# Patient Record
Sex: Female | Born: 1960 | Hispanic: Yes | Marital: Married | Smoking: Never smoker
Health system: Southern US, Community
[De-identification: ages and names within clinical notes are randomized; demographics above are authoritative.]

## PROBLEM LIST (undated history)

## (undated) DIAGNOSIS — K219 Gastro-esophageal reflux disease without esophagitis: Secondary | ICD-10-CM

## (undated) DIAGNOSIS — F32A Depression, unspecified: Secondary | ICD-10-CM

## (undated) DIAGNOSIS — E119 Type 2 diabetes mellitus without complications: Secondary | ICD-10-CM

## (undated) DIAGNOSIS — I2510 Atherosclerotic heart disease of native coronary artery without angina pectoris: Secondary | ICD-10-CM

## (undated) DIAGNOSIS — E7800 Pure hypercholesterolemia, unspecified: Secondary | ICD-10-CM

## (undated) DIAGNOSIS — I10 Essential (primary) hypertension: Secondary | ICD-10-CM

## (undated) DIAGNOSIS — F329 Major depressive disorder, single episode, unspecified: Secondary | ICD-10-CM

## (undated) DIAGNOSIS — I471 Supraventricular tachycardia: Secondary | ICD-10-CM

## (undated) HISTORY — DX: Pure hypercholesterolemia, unspecified: E78.00

## (undated) HISTORY — PX: CARDIAC SURGERY: SHX584

## (undated) HISTORY — DX: Major depressive disorder, single episode, unspecified: F32.9

## (undated) HISTORY — PX: CATARACT EXTRACTION W/ INTRAOCULAR LENS  IMPLANT, BILATERAL: SHX1307

## (undated) HISTORY — DX: Depression, unspecified: F32.A

---

## 1998-12-17 HISTORY — PX: CORONARY ARTERY BYPASS GRAFT: SHX141

## 2000-06-27 ENCOUNTER — Encounter: Payer: Self-pay | Admitting: Thoracic Surgery (Cardiothoracic Vascular Surgery)

## 2000-06-27 ENCOUNTER — Inpatient Hospital Stay (HOSPITAL_COMMUNITY)
Admission: AD | Admit: 2000-06-27 | Discharge: 2000-07-03 | Payer: Self-pay | Admitting: Thoracic Surgery (Cardiothoracic Vascular Surgery)

## 2000-06-28 ENCOUNTER — Encounter: Payer: Self-pay | Admitting: Thoracic Surgery (Cardiothoracic Vascular Surgery)

## 2000-06-29 ENCOUNTER — Encounter: Payer: Self-pay | Admitting: Thoracic Surgery (Cardiothoracic Vascular Surgery)

## 2000-06-30 ENCOUNTER — Encounter: Payer: Self-pay | Admitting: Thoracic Surgery (Cardiothoracic Vascular Surgery)

## 2005-04-11 ENCOUNTER — Emergency Department: Payer: Self-pay | Admitting: Emergency Medicine

## 2010-07-17 HISTORY — PX: CARDIAC CATHETERIZATION: SHX172

## 2012-03-18 ENCOUNTER — Ambulatory Visit: Payer: Self-pay | Admitting: Ophthalmology

## 2012-03-26 ENCOUNTER — Ambulatory Visit: Payer: Self-pay | Admitting: Ophthalmology

## 2012-04-29 ENCOUNTER — Emergency Department: Payer: Self-pay | Admitting: *Deleted

## 2012-04-29 LAB — CBC
HCT: 41.3 % (ref 35.0–47.0)
HGB: 13.4 g/dL (ref 12.0–16.0)
MCH: 28.8 pg (ref 26.0–34.0)
MCHC: 32.4 g/dL (ref 32.0–36.0)
MCV: 89 fL (ref 80–100)
Platelet: 250 10*3/uL (ref 150–440)
RBC: 4.64 10*6/uL (ref 3.80–5.20)
RDW: 15.3 % — ABNORMAL HIGH (ref 11.5–14.5)
WBC: 8.5 10*3/uL (ref 3.6–11.0)

## 2012-04-29 LAB — MAGNESIUM: Magnesium: 1.4 mg/dL — ABNORMAL LOW

## 2012-04-29 LAB — CK TOTAL AND CKMB (NOT AT ARMC)
CK, Total: 139 U/L (ref 21–215)
CK-MB: 1.8 ng/mL (ref 0.5–3.6)

## 2012-04-29 LAB — COMPREHENSIVE METABOLIC PANEL
Albumin: 3.5 g/dL (ref 3.4–5.0)
Alkaline Phosphatase: 106 U/L (ref 50–136)
Anion Gap: 10 (ref 7–16)
BUN: 9 mg/dL (ref 7–18)
Bilirubin,Total: 0.3 mg/dL (ref 0.2–1.0)
Calcium, Total: 8.2 mg/dL — ABNORMAL LOW (ref 8.5–10.1)
Chloride: 106 mmol/L (ref 98–107)
Co2: 26 mmol/L (ref 21–32)
Creatinine: 0.65 mg/dL (ref 0.60–1.30)
EGFR (African American): >60
EGFR (Non-African Amer.): >60
Glucose: 195 mg/dL — ABNORMAL HIGH (ref 65–99)
Osmolality: 287 (ref 275–301)
Potassium: 3.9 mmol/L (ref 3.5–5.1)
SGOT(AST): 19 U/L (ref 15–37)
SGPT (ALT): 25 U/L
Sodium: 142 mmol/L (ref 136–145)
Total Protein: 7 g/dL (ref 6.4–8.2)

## 2012-04-29 LAB — TROPONIN I: Troponin-I: <0.02 ng/mL

## 2012-04-29 LAB — TSH: Thyroid Stimulating Horm: 4.38 u[IU]/mL

## 2012-05-21 ENCOUNTER — Emergency Department: Payer: Self-pay | Admitting: *Deleted

## 2012-05-21 LAB — COMPREHENSIVE METABOLIC PANEL
Alkaline Phosphatase: 139 U/L — ABNORMAL HIGH (ref 50–136)
Anion Gap: 8 (ref 7–16)
BUN: 16 mg/dL (ref 7–18)
Bilirubin,Total: 0.3 mg/dL (ref 0.2–1.0)
Creatinine: 0.61 mg/dL (ref 0.60–1.30)
EGFR (African American): >60
EGFR (Non-African Amer.): >60
Glucose: 298 mg/dL — ABNORMAL HIGH (ref 65–99)
Potassium: 3.8 mmol/L (ref 3.5–5.1)
Total Protein: 7.8 g/dL (ref 6.4–8.2)

## 2012-05-21 LAB — CBC
HCT: 42.9 % (ref 35.0–47.0)
HGB: 13.3 g/dL (ref 12.0–16.0)
MCH: 27.7 pg (ref 26.0–34.0)
MCV: 89 fL (ref 80–100)
Platelet: 270 10*3/uL (ref 150–440)
RDW: 15.2 % — ABNORMAL HIGH (ref 11.5–14.5)

## 2012-05-21 LAB — MAGNESIUM: Magnesium: 1.8 mg/dL

## 2012-05-21 LAB — TROPONIN I: Troponin-I: 0.03 ng/mL

## 2012-05-21 LAB — CK TOTAL AND CKMB (NOT AT ARMC)
CK, Total: 186 U/L (ref 21–215)
CK-MB: 2.1 ng/mL (ref 0.5–3.6)

## 2012-07-07 ENCOUNTER — Emergency Department: Payer: Self-pay | Admitting: Unknown Physician Specialty

## 2012-07-07 LAB — COMPREHENSIVE METABOLIC PANEL
Albumin: 3.6 g/dL (ref 3.4–5.0)
Alkaline Phosphatase: 124 U/L (ref 50–136)
Anion Gap: 12 (ref 7–16)
BUN: 13 mg/dL (ref 7–18)
Bilirubin,Total: 0.3 mg/dL (ref 0.2–1.0)
Calcium, Total: 8.9 mg/dL (ref 8.5–10.1)
Chloride: 102 mmol/L (ref 98–107)
Co2: 25 mmol/L (ref 21–32)
Creatinine: 0.64 mg/dL (ref 0.60–1.30)
EGFR (African American): >60
EGFR (Non-African Amer.): >60
Glucose: 280 mg/dL — ABNORMAL HIGH (ref 65–99)
Osmolality: 288 (ref 275–301)
Potassium: 3.7 mmol/L (ref 3.5–5.1)
SGOT(AST): 16 U/L (ref 15–37)
SGPT (ALT): 30 U/L
Sodium: 139 mmol/L (ref 136–145)
Total Protein: 7.1 g/dL (ref 6.4–8.2)

## 2012-07-07 LAB — CBC
HCT: 37.9 % (ref 35.0–47.0)
HGB: 12 g/dL (ref 12.0–16.0)
MCH: 27.8 pg (ref 26.0–34.0)
MCHC: 31.6 g/dL — ABNORMAL LOW (ref 32.0–36.0)
MCV: 88 fL (ref 80–100)
Platelet: 256 10*3/uL (ref 150–440)
RBC: 4.3 10*6/uL (ref 3.80–5.20)
RDW: 14.9 % — ABNORMAL HIGH (ref 11.5–14.5)
WBC: 13.2 10*3/uL — ABNORMAL HIGH (ref 3.6–11.0)

## 2012-07-15 ENCOUNTER — Ambulatory Visit: Payer: Self-pay | Admitting: Ophthalmology

## 2012-10-14 ENCOUNTER — Ambulatory Visit: Payer: Self-pay | Admitting: Family Medicine

## 2013-07-14 DIAGNOSIS — I471 Supraventricular tachycardia: Secondary | ICD-10-CM | POA: Insufficient documentation

## 2013-07-14 DIAGNOSIS — E785 Hyperlipidemia, unspecified: Secondary | ICD-10-CM | POA: Insufficient documentation

## 2013-07-14 DIAGNOSIS — I2510 Atherosclerotic heart disease of native coronary artery without angina pectoris: Secondary | ICD-10-CM | POA: Insufficient documentation

## 2013-07-14 DIAGNOSIS — I739 Peripheral vascular disease, unspecified: Secondary | ICD-10-CM | POA: Insufficient documentation

## 2014-03-20 ENCOUNTER — Emergency Department: Payer: Self-pay | Admitting: Emergency Medicine

## 2014-03-20 LAB — COMPREHENSIVE METABOLIC PANEL
ALBUMIN: 3.5 g/dL (ref 3.4–5.0)
AST: 18 U/L (ref 15–37)
Alkaline Phosphatase: 124 U/L — ABNORMAL HIGH
Anion Gap: 5 — ABNORMAL LOW (ref 7–16)
BUN: 10 mg/dL (ref 7–18)
Bilirubin,Total: 0.3 mg/dL (ref 0.2–1.0)
Calcium, Total: 9.4 mg/dL (ref 8.5–10.1)
Chloride: 100 mmol/L (ref 98–107)
Co2: 30 mmol/L (ref 21–32)
Creatinine: 0.86 mg/dL (ref 0.60–1.30)
EGFR (African American): >60
EGFR (Non-African Amer.): >60
GLUCOSE: 319 mg/dL — AB (ref 65–99)
Osmolality: 281 (ref 275–301)
POTASSIUM: 4.9 mmol/L (ref 3.5–5.1)
SGPT (ALT): 24 U/L (ref 12–78)
Sodium: 135 mmol/L — ABNORMAL LOW (ref 136–145)
TOTAL PROTEIN: 7.1 g/dL (ref 6.4–8.2)

## 2014-03-20 LAB — CBC
HCT: 36.9 % (ref 35.0–47.0)
HGB: 11.8 g/dL — ABNORMAL LOW (ref 12.0–16.0)
MCH: 26.6 pg (ref 26.0–34.0)
MCHC: 32 g/dL (ref 32.0–36.0)
MCV: 83 fL (ref 80–100)
Platelet: 261 10*3/uL (ref 150–440)
RBC: 4.43 10*6/uL (ref 3.80–5.20)
RDW: 16.6 % — ABNORMAL HIGH (ref 11.5–14.5)
WBC: 8.8 10*3/uL (ref 3.6–11.0)

## 2014-04-01 ENCOUNTER — Encounter: Payer: Self-pay | Admitting: Surgery

## 2014-04-08 ENCOUNTER — Ambulatory Visit: Payer: Self-pay | Admitting: Surgery

## 2014-04-16 ENCOUNTER — Encounter: Payer: Self-pay | Admitting: Surgery

## 2014-05-17 ENCOUNTER — Encounter: Payer: Self-pay | Admitting: Surgery

## 2014-05-24 IMAGING — CR RIGHT FOOT - 2 VIEW
1 series · 2 of 2 positions shown · non-contrast
Comparison: None.

CLINICAL DATA: Diabetic ulcer

EXAM:
RIGHT FOOT - 2 VIEW

[Series 1: ap · 0.17mm/px · 2 of 2 slices shown]
[im 1/2]
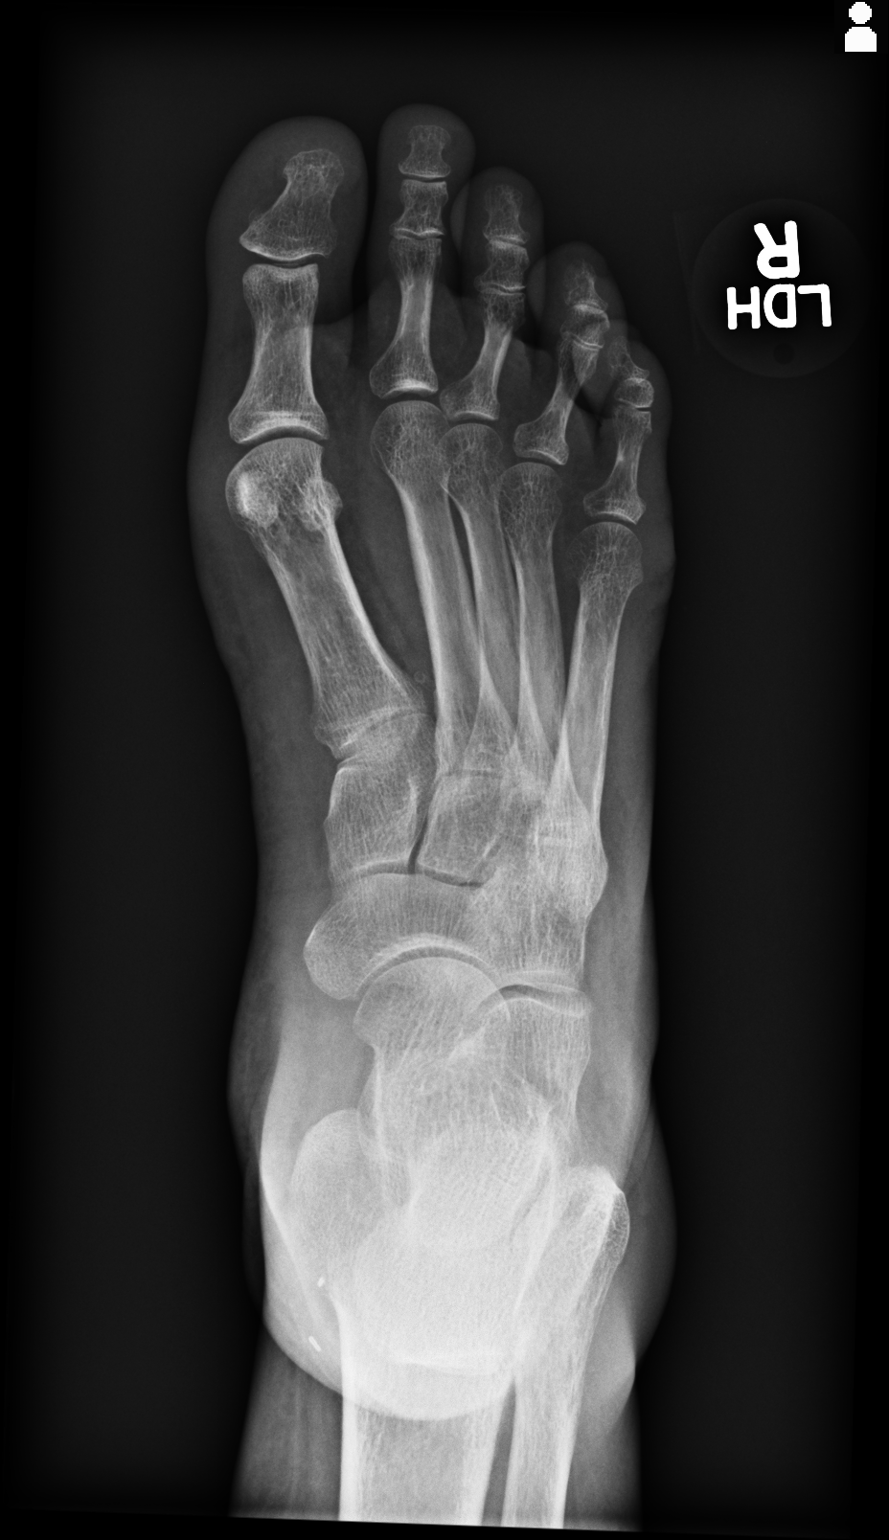
[im 2/2]
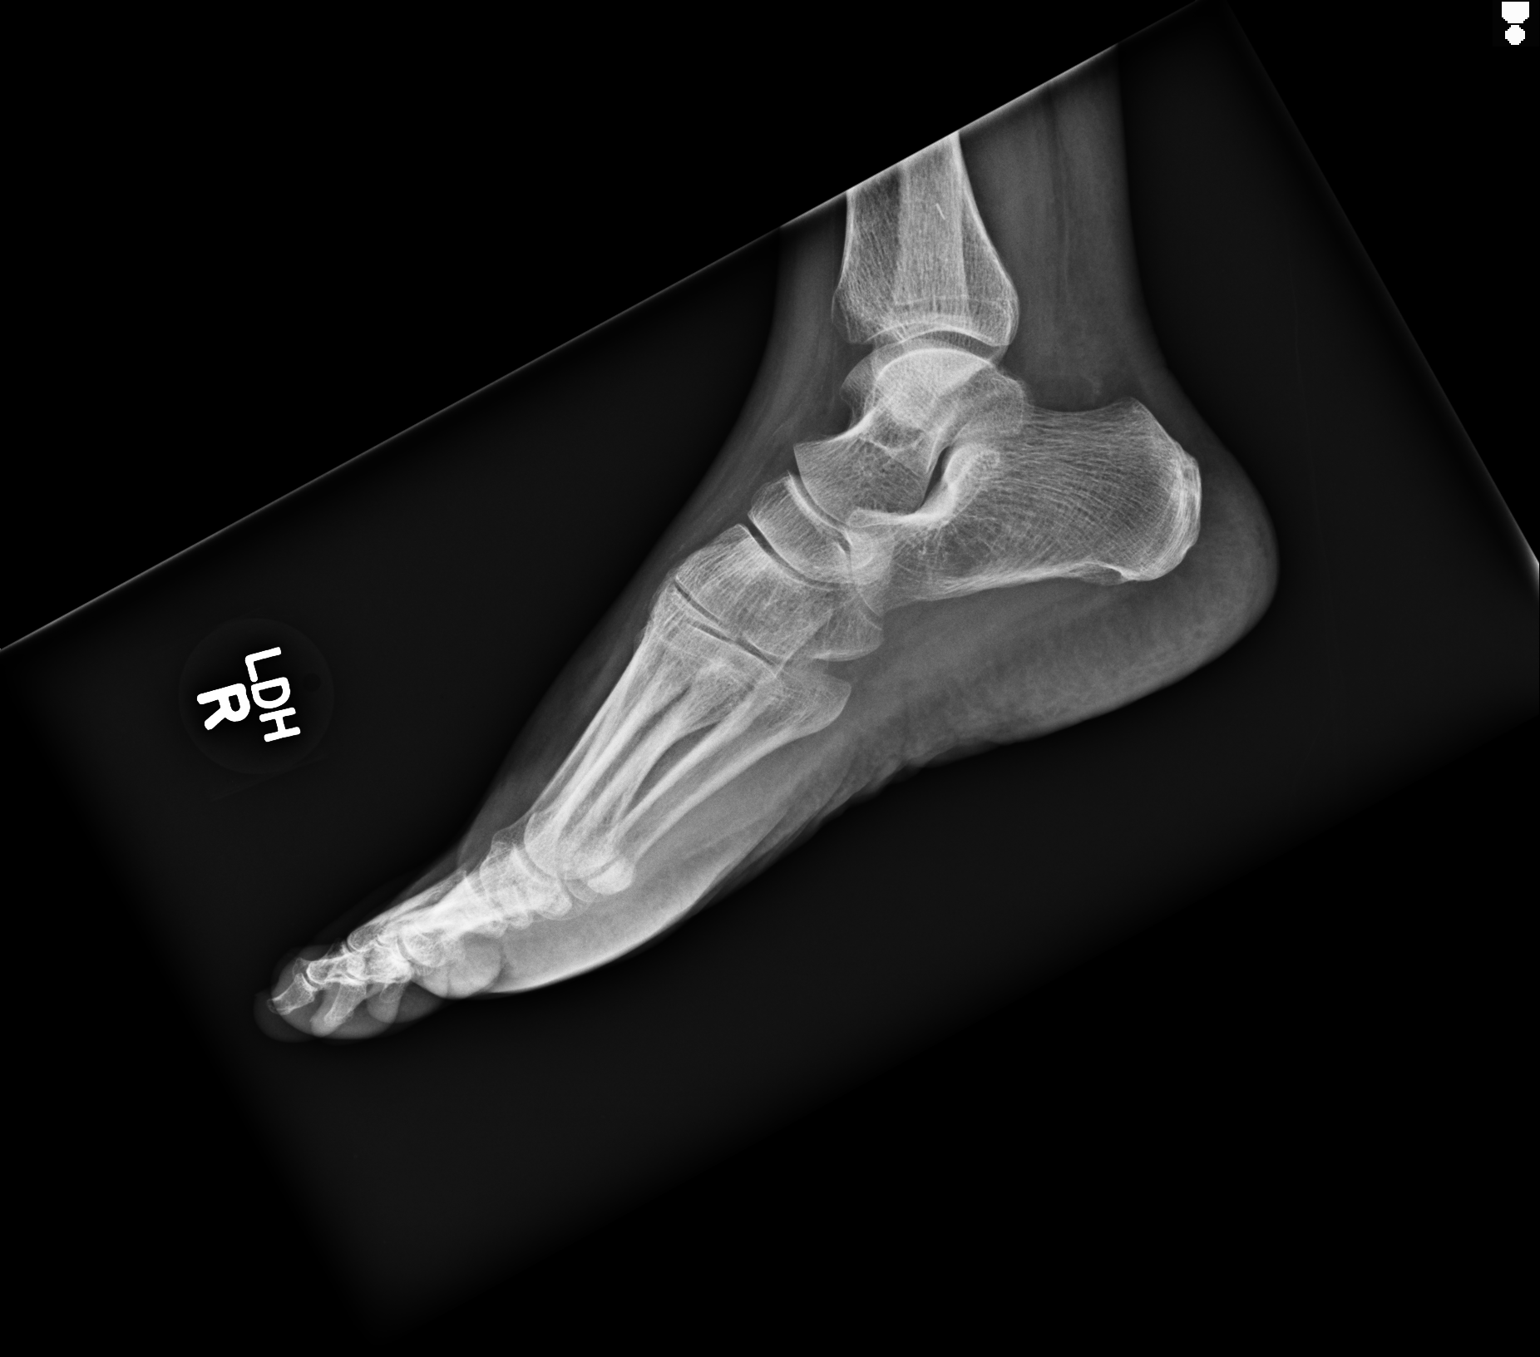

[2 of 2 positions shown; findings below may reference images not displayed]

FINDINGS: Frontal and lateral views were obtained. There is no fracture or
dislocation. Joint spaces appear intact. No erosive change or bony
destruction. There is no apparent soft tissue abscess.
IMPRESSION: No erosive change or bony destruction. No soft tissue abscess
apparent. No fracture or dislocation.

## 2014-06-12 IMAGING — CR RIGHT FOOT COMPLETE - 3+ VIEW
1 series · 3 of 3 positions shown · non-contrast
Comparison: None.

CLINICAL DATA: Nonhealing wound

EXAM:
RIGHT FOOT COMPLETE - 3+ VIEW

[Series 1: x foot ap right · 0.14mm/px · 3 of 3 slices shown]
[im 1/3]
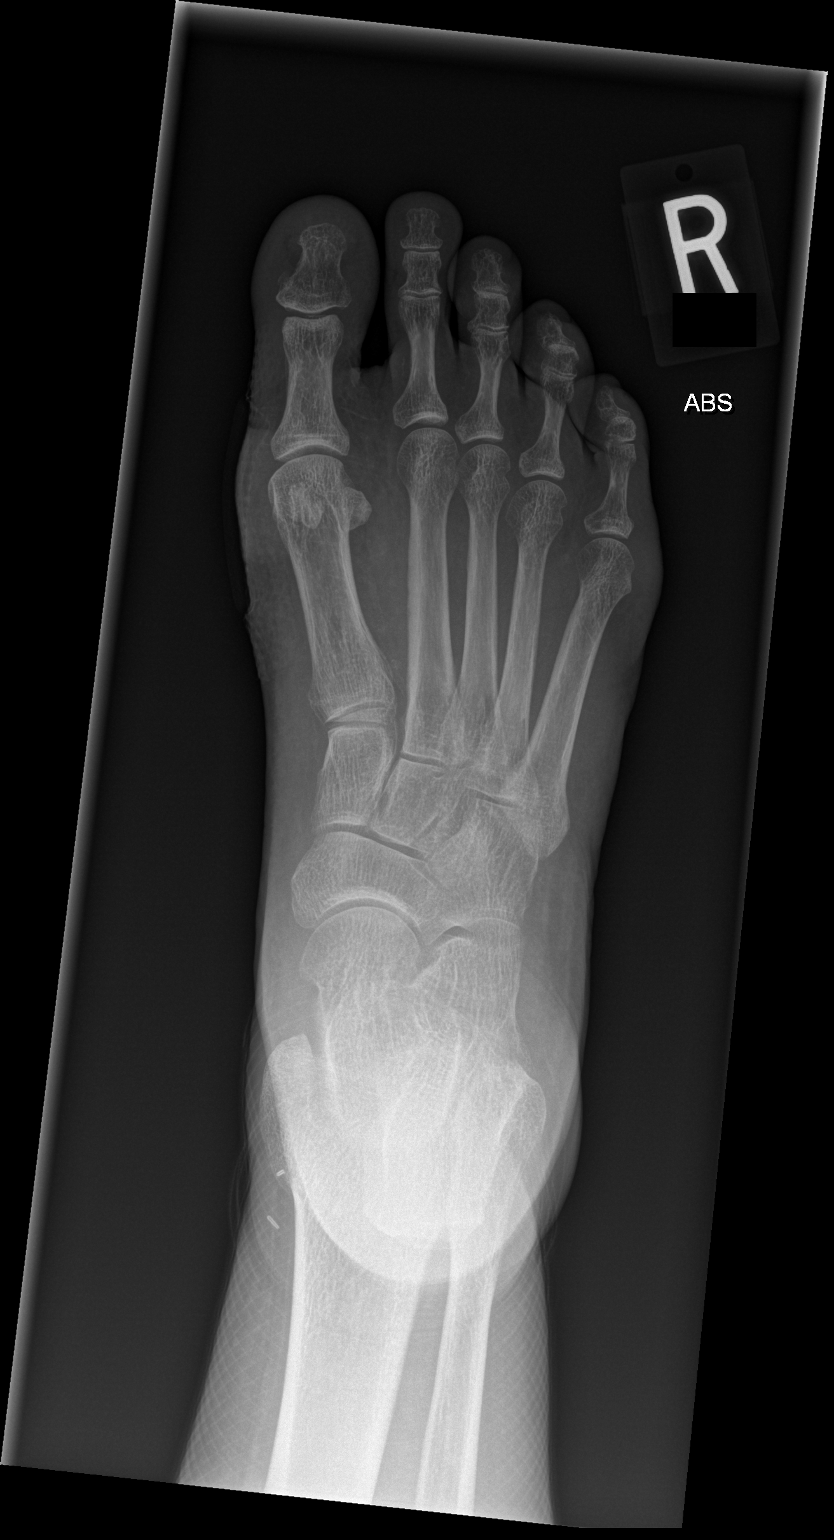
[im 2/3]
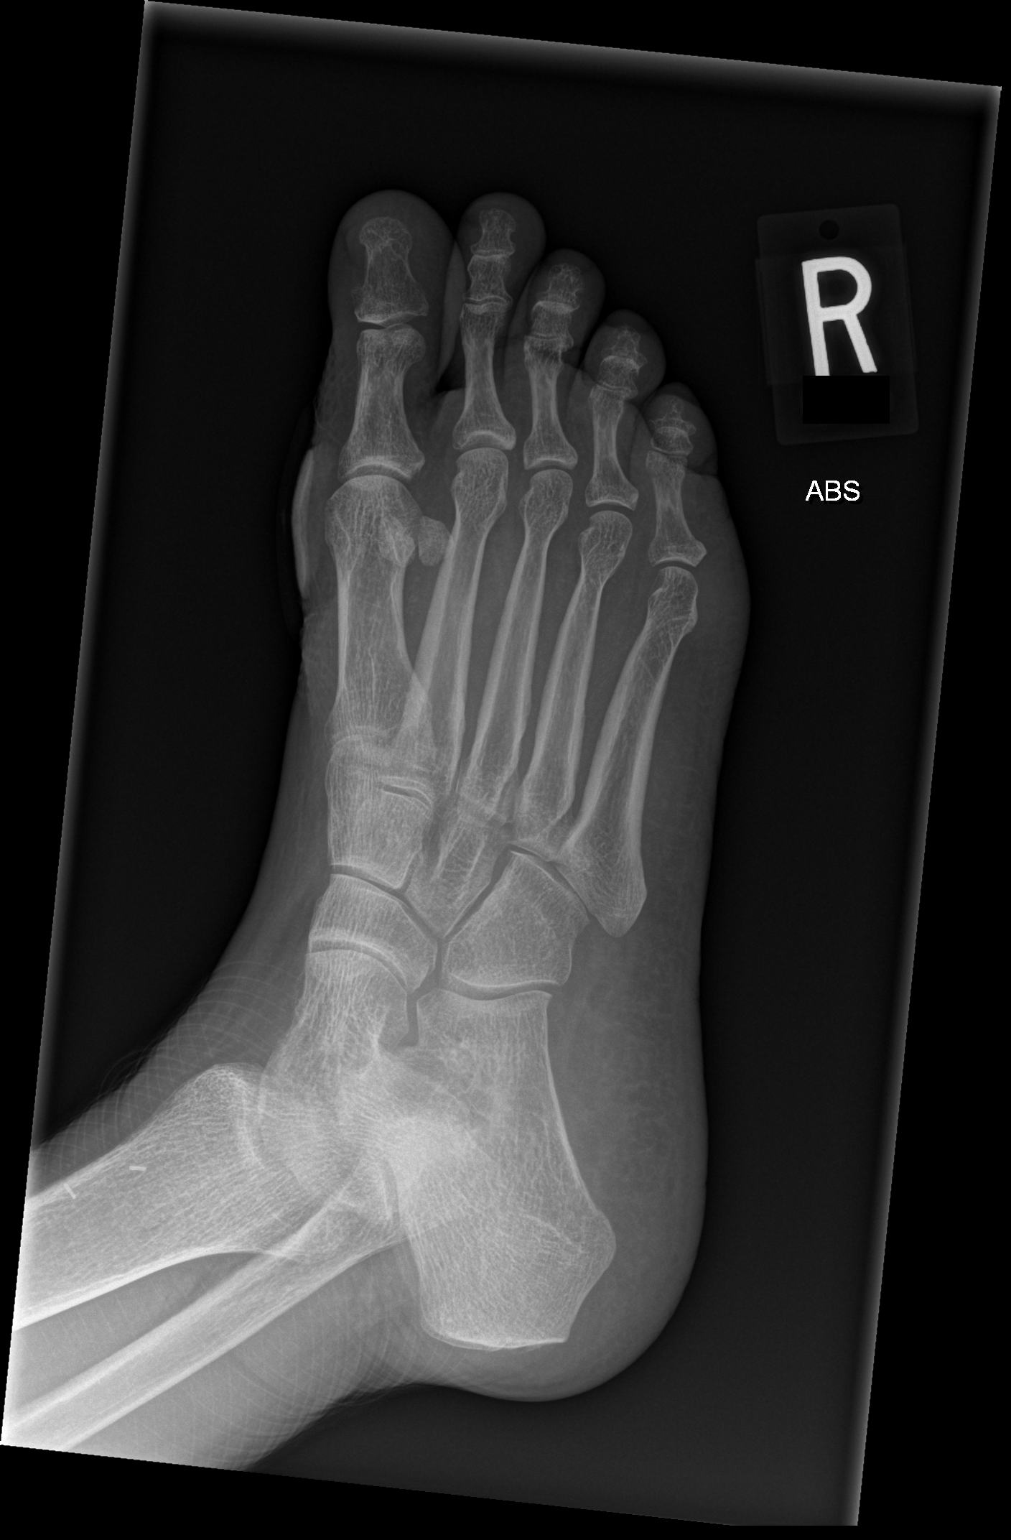
[im 3/3]
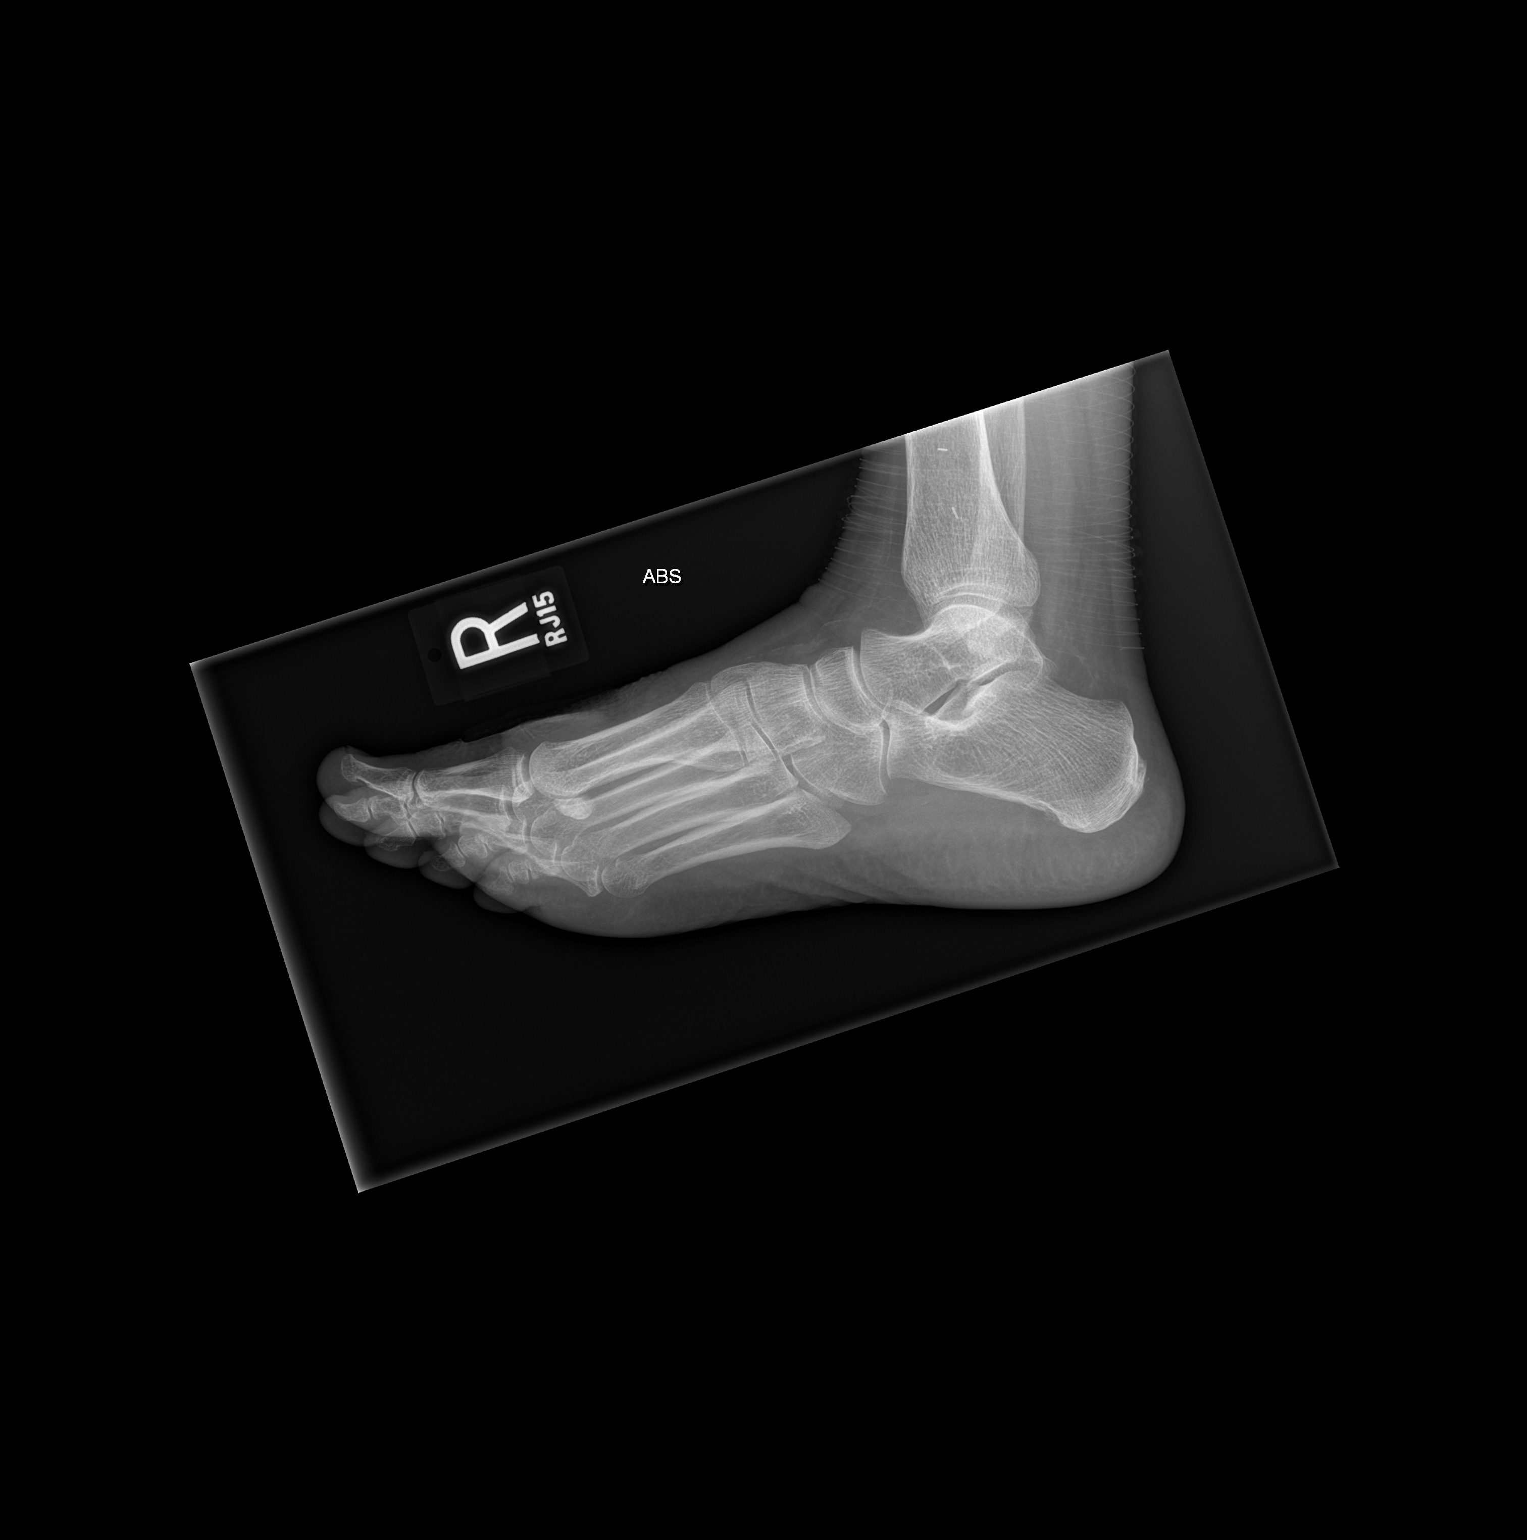

[3 of 3 positions shown; findings below may reference images not displayed]

FINDINGS: No acute bony abnormality is identified. Soft tissue changes are
seen consistent with the given clinical history. No erosion to
suggest osteomyelitis is noted.
IMPRESSION: No acute bony abnormality noted.

## 2014-07-26 ENCOUNTER — Encounter: Payer: Self-pay | Admitting: Surgery

## 2014-08-17 ENCOUNTER — Encounter: Payer: Self-pay | Admitting: Surgery

## 2014-09-04 LAB — WOUND AEROBIC CULTURE

## 2014-09-16 ENCOUNTER — Encounter: Payer: Self-pay | Admitting: Surgery

## 2014-10-17 ENCOUNTER — Encounter: Payer: Self-pay | Admitting: Surgery

## 2015-04-05 NOTE — Op Note (Signed)
PATIENT NAME:  Catherine Kirby, [name] MR#:  [id number] DATE OF BIRTH:  30-Oct-1961  DATE OF PROCEDURE:  07/15/2012  PREOPERATIVE DIAGNOSIS: Visually significant cataract of the left eye.   POSTOPERATIVE DIAGNOSIS: Visually significant cataract of the left eye.   OPERATIVE PROCEDURE: Cataract extraction by phacoemulsification with implant of intraocular lens to left eye.   SURGEON: Galen ManilaWilliam Esmond Hinch, MD.   ANESTHESIA:  1. Managed anesthesia care.  2. Topical tetracaine drops followed by 2% Xylocaine jelly applied in the preoperative holding area.   COMPLICATIONS: None.   TECHNIQUE:  Stop and chop.    DESCRIPTION OF PROCEDURE: The patient was examined and consented in the preoperative holding area where the aforementioned topical anesthesia was applied to the left eye and then brought back to the Operating Room where the left eye was prepped and draped in the usual sterile ophthalmic fashion and a lid speculum was placed. A paracentesis was created with the side port blade and the anterior chamber was filled with viscoelastic. A near clear corneal incision was performed with the steel keratome. A continuous curvilinear capsulorrhexis was performed with a cystotome followed by the capsulorrhexis forceps. Hydrodissection and hydrodelineation were carried out with BSS on a blunt cannula. The lens was removed in a stop and chop technique and the remaining cortical material was removed with the irrigation-aspiration handpiece. The capsular bag was inflated with viscoelastic and the Technis ZCB00 23.0-diopter lens, serial number [id number] was placed in the capsular bag without complication. The remaining viscoelastic was removed from the eye with the irrigation-aspiration handpiece. The wounds were hydrated. The anterior chamber was flushed with Miostat and the eye was inflated to physiologic pressure. The wounds were found to be water tight. The eye was dressed with Vigamox. The patient was given protective  glasses to wear throughout the day and a shield with which to sleep tonight. The patient was also given drops with which to begin a drop regimen today and will follow-up with me in one day.   ____________________________ Jerilee FieldWilliam L. Vyla Pint, MD wlp:cms D: 07/15/2012 12:57:14 ET T: 07/15/2012 13:03:04 ET JOB#: [id number]  cc: Toneka Fullen L. Pinki Rottman, MD, <Dictator>  Jerilee FieldWILLIAM L Diane Hanel MD ELECTRONICALLY SIGNED 07/22/2012 12:51

## 2015-04-10 NOTE — Op Note (Signed)
PATIENT NAME:  Catherine Kirby, Catherine Kirby MR#:  [id number] DATE OF BIRTH:  02/02/1961  DATE OF PROCEDURE:  03/26/2012  PREOPERATIVE DIAGNOSIS: Visually significant cataract of the right eye.   POSTOPERATIVE DIAGNOSIS: Visually significant cataract of the right eye.   OPERATIVE PROCEDURE: Cataract extraction by phacoemulsification with implant of intraocular lens to right eye.   SURGEON: Galen ManilaWilliam Jacquelyne Quarry, MD.   ANESTHESIA:  1. Managed anesthesia care.  2. Topical tetracaine drops followed by 2% Xylocaine jelly applied in the preoperative holding area.   COMPLICATIONS: None.   TECHNIQUE:  Stop-and-chop   DESCRIPTION OF PROCEDURE: The patient was examined and consented in the preoperative holding area where the aforementioned topical anesthesia was applied to the right eye and then brought back to the Operating Room where the right eye was prepped and draped in the usual sterile ophthalmic fashion and a lid speculum was placed. A paracentesis was created with the side port blade and the anterior chamber was filled with viscoelastic. A near clear corneal incision was performed with the steel keratome. A continuous curvilinear capsulorrhexis was performed with a cystotome followed by the capsulorrhexis forceps. Hydrodissection and hydrodelineation were carried out with BSS on a blunt cannula. The lens was removed in a stop-and-chop technique and the remaining cortical material was removed with the irrigation-aspiration handpiece. The capsular bag was inflated with viscoelastic and the Technus ZCB00 23.5-diopter lens, serial number [id number] was placed in the capsular bag without complication. The remaining viscoelastic was removed from the eye with the irrigation-aspiration handpiece. The wounds were hydrated. The anterior chamber was flushed with Miostat and the eye was inflated to physiologic pressure. The wounds were found to be water tight. The eye was dressed with Vigamox. The patient was given  protective glasses to wear throughout the day and a shield with which to sleep tonight. The patient was also given drops with which to begin a drop regimen today and will follow-up with me in one day.   ____________________________ Jerilee FieldWilliam L. Nahome Bublitz, MD wlp:drc D: 03/26/2012 11:37:41 ET T: 03/26/2012 12:03:35 ET JOB#: [id number]  cc: Jerek Meulemans L. Felesha Moncrieffe, MD, <Dictator> Jerilee FieldWILLIAM L Dannetta Lekas MD ELECTRONICALLY SIGNED 04/01/2012 12:25

## 2017-01-01 ENCOUNTER — Other Ambulatory Visit: Payer: Self-pay

## 2017-01-01 ENCOUNTER — Ambulatory Visit (INDEPENDENT_AMBULATORY_CARE_PROVIDER_SITE_OTHER): Payer: Medicare Other | Admitting: Gastroenterology

## 2017-01-01 ENCOUNTER — Encounter: Payer: Self-pay | Admitting: Gastroenterology

## 2017-01-01 VITALS — BP 123/63 | HR 73 | Temp 98.8°F | Ht 60.0 in | Wt 148.0 lb

## 2017-01-01 DIAGNOSIS — D500 Iron deficiency anemia secondary to blood loss (chronic): Secondary | ICD-10-CM

## 2017-01-01 NOTE — Progress Notes (Signed)
Gastroenterology Consultation  Referring Provider:     Dr. Yaakov Guthrie  Primary Care Physician:  [facility] Primary Gastroenterologist:  Dr. Servando Snare     Reason for Consultation:     Iron deficiency anemia        HPI:   Catherine Kirby is a 56 y.o. y/o female referred for consultation & management of Iron deficiency anemia by Dr. [facility].  This patient comes today with a finding of iron deficiency anemia.  This patient has a new onset of anemia.  The patient's last blood draw was reported with a hemoglobin of 10.4 with all previous records showing the hemoglobin at 11.8.  The patient's MCV had been historically normal but is now low.  The patient denies any black stools or bloody stools.  The patient does report that she has chronic constipation.  There is no report of any change in her bowel habits.  She also denies any previous anemia.  She is not having her menstrual cycle anymore.  There is no report of any abdominal pain or nausea or vomiting.  History reviewed. No pertinent past medical history.  Past Surgical History:  Procedure Laterality Date  . CARDIAC SURGERY      Prior to Admission medications   Medication Sig Start Date End Date Taking? Authorizing Provider  amitriptyline (ELAVIL) 25 MG tablet  12/24/16  Yes Historical Provider, MD  atorvastatin (LIPITOR) 80 MG tablet 1 BY MOUTH DAILY IN THE EVENING FOR HIGH CHOLESTEROL 10/18/16  Yes Historical Provider, MD  B-D INS SYR ULTRAFINE 1CC/30G 30G X 1/2" 1 ML MISC AS DIRECTED FOR INSULIN AS NEEDED FOR DIABETES 11/29/16  Yes Historical Provider, MD  citalopram (CELEXA) 20 MG tablet Take 20 mg by mouth daily. 11/27/16  Yes Historical Provider, MD  ferrous sulfate 325 (65 FE) MG tablet 1 BY MOUTH 3 TIMES A DAY FOR ANEMIA 12/11/16  Yes Historical Provider, MD  gabapentin (NEURONTIN) 100 MG capsule 1 BY MOUTH 2 TIMES EACH DAY 12/03/16  Yes Historical Provider, MD  LEVEMIR 100 UNIT/ML injection  12/28/16  Yes  Historical Provider, MD  metFORMIN (GLUCOPHAGE-XR) 500 MG 24 hr tablet Take 1,000 mg by mouth 2 (two) times daily. 12/07/16  Yes Historical Provider, MD  metoprolol succinate (TOPROL-XL) 100 MG 24 hr tablet Take 100 mg by mouth daily. 10/17/16  Yes Historical Provider, MD  naproxen (NAPROSYN) 250 MG tablet  12/28/16  Yes Historical Provider, MD  naproxen (NAPROSYN) 250 MG tablet Take 250 mg by mouth.   Yes Historical Provider, MD  ranitidine (ZANTAC) 150 MG tablet Take 150 mg by mouth.   Yes Historical Provider, MD    Family History  Problem Relation Age of Onset  . Diabetes Mother   . Diabetes Father      Social History  Substance Use Topics  . Smoking status: Never Smoker  . Smokeless tobacco: Never Used  . Alcohol use No    Allergies as of 01/01/2017  . (Not on File)    Review of Systems:    All systems reviewed and negative except where noted in HPI.   Physical Exam:  BP 123/63   Pulse 73   Temp 98.8 F (37.1 C) (Oral)   Ht 5' (1.524 m)   Wt 148 lb (67.1 kg)   BMI 28.90 kg/m  No LMP recorded. Psych:  Alert and cooperative. Normal mood and affect. General:   Alert,  Well-developed, well-nourished, pleasant and cooperative in NAD Head:  Normocephalic  and atraumatic. Eyes:  Sclera clear, no icterus.   Conjunctiva pink. Ears:  Normal auditory acuity. Nose:  No deformity, discharge, or lesions. Mouth:  No deformity or lesions,oropharynx pink & moist. Neck:  Supple; no masses or thyromegaly. Lungs:  Respirations even and unlabored.  Clear throughout to auscultation.   No wheezes, crackles, or rhonchi. No acute distress. Heart:  Regular rate and rhythm; no murmurs, clicks, rubs, or gallops. Abdomen:  Normal bowel sounds.  No bruits.  Soft, non-tender and non-distended without masses, hepatosplenomegaly or hernias noted.  No guarding or rebound tenderness.  Negative Carnett sign.   Rectal:  Deferred.  Msk:  Symmetrical without gross deformities.  Good, equal movement &  strength bilaterally. Pulses:  Normal pulses noted. Extremities:  No clubbing or edema.  No cyanosis. Neurologic:  Alert and oriented x3;  grossly normal neurologically. Skin:  Intact without significant lesions or rashes.  No jaundice. Lymph Nodes:  No significant cervical adenopathy. Psych:  Alert and cooperative. Normal mood and affect.  Imaging Studies: No results found.  Assessment and Plan:   Catherine Kirby is a 56 y.o. y/o female who comes in today with a new onset of anemia with a low MCV.  The patient denies any sign of overt bleeding.  The patient has never had a colonoscopy.  The patient will be set up for an EGD and colonoscopy to look for the source of her anemia. The patient has been explained the plan and agrees with it.    Midge Miniumarren Bryona Foxworthy, MD. Clementeen GrahamFACG   Note: This dictation was prepared with Dragon dictation along with smaller phrase technology. Any transcriptional errors that result from this process are unintentional.

## 2017-01-02 ENCOUNTER — Other Ambulatory Visit: Payer: Self-pay

## 2017-01-08 ENCOUNTER — Encounter: Admission: RE | Payer: Self-pay | Source: Ambulatory Visit

## 2017-01-08 ENCOUNTER — Ambulatory Visit: Admission: RE | Admit: 2017-01-08 | Payer: Medicare Other | Source: Ambulatory Visit | Admitting: Gastroenterology

## 2017-01-08 SURGERY — COLONOSCOPY WITH PROPOFOL
Anesthesia: General

## 2017-01-18 ENCOUNTER — Other Ambulatory Visit: Payer: Self-pay

## 2017-01-22 NOTE — Discharge Instructions (Signed)

## 2017-01-23 ENCOUNTER — Encounter: Payer: Self-pay | Admitting: *Deleted

## 2017-01-23 ENCOUNTER — Encounter: Payer: Self-pay | Admitting: Anesthesiology

## 2017-01-24 ENCOUNTER — Telehealth: Payer: Self-pay | Admitting: Gastroenterology

## 2017-01-24 NOTE — Telephone Encounter (Signed)
Patient called to cancel her colonoscopy tomorrow. She isn't feeling well and her sugar is dropping. Having trouble with the liquid diet today. They will call back to reschedule. I called [facility]

## 2017-01-25 ENCOUNTER — Ambulatory Visit: Admission: RE | Admit: 2017-01-25 | Payer: Medicare Other | Source: Ambulatory Visit | Admitting: Gastroenterology

## 2017-01-25 HISTORY — DX: Type 2 diabetes mellitus without complications: E11.9

## 2017-01-25 HISTORY — DX: Supraventricular tachycardia: I47.1

## 2017-01-25 HISTORY — DX: Atherosclerotic heart disease of native coronary artery without angina pectoris: I25.10

## 2017-01-25 SURGERY — COLONOSCOPY WITH PROPOFOL
Anesthesia: Choice

## 2017-05-10 ENCOUNTER — Other Ambulatory Visit: Payer: Self-pay

## 2017-05-10 DIAGNOSIS — D509 Iron deficiency anemia, unspecified: Secondary | ICD-10-CM

## 2017-05-23 ENCOUNTER — Telehealth: Payer: Self-pay | Admitting: Gastroenterology

## 2017-05-23 NOTE — Telephone Encounter (Signed)
Patient doesn't understand English. Can you please call her daughter to discuss your phone call. Catherine MoloneyFabiola Cell [phone] Work [phone]

## 2017-05-24 NOTE — Telephone Encounter (Signed)
Spoke with pt's daughter, Catherine MoloneyFabiola regarding her upcoming colon[facility] that is [facility]. She stated her mother was not aware of this appt. I advised her, her pt's PCP contacted our office during her last ov to schedule this as she was there at that time. Told her I advised the nurse to have the doctor to speak with her regarding her sugars and how to prep with these. She will check with her mother and call me back.

## 2017-05-27 ENCOUNTER — Encounter: Payer: Self-pay | Admitting: *Deleted

## 2017-05-28 ENCOUNTER — Encounter
Admission: RE | Disposition: A | Discharge status: Home / Self Care | Payer: Self-pay | Source: Ambulatory Visit | Attending: Gastroenterology

## 2017-05-28 ENCOUNTER — Ambulatory Visit
Admission: RE | Admit: 2017-05-28 | Discharge: 2017-05-28 | Disposition: A | Discharge status: Home / Self Care | Payer: Medicare Other | Source: Ambulatory Visit | Attending: Gastroenterology | Admitting: Gastroenterology

## 2017-05-28 ENCOUNTER — Ambulatory Visit: Payer: Medicare Other | Admitting: Certified Registered"

## 2017-05-28 DIAGNOSIS — I471 Supraventricular tachycardia: Secondary | ICD-10-CM | POA: Diagnosis not present

## 2017-05-28 DIAGNOSIS — K219 Gastro-esophageal reflux disease without esophagitis: Secondary | ICD-10-CM | POA: Insufficient documentation

## 2017-05-28 DIAGNOSIS — E119 Type 2 diabetes mellitus without complications: Secondary | ICD-10-CM | POA: Diagnosis not present

## 2017-05-28 DIAGNOSIS — Z9842 Cataract extraction status, left eye: Secondary | ICD-10-CM | POA: Diagnosis not present

## 2017-05-28 DIAGNOSIS — D509 Iron deficiency anemia, unspecified: Secondary | ICD-10-CM | POA: Diagnosis present

## 2017-05-28 DIAGNOSIS — I10 Essential (primary) hypertension: Secondary | ICD-10-CM | POA: Insufficient documentation

## 2017-05-28 DIAGNOSIS — Z9841 Cataract extraction status, right eye: Secondary | ICD-10-CM | POA: Insufficient documentation

## 2017-05-28 DIAGNOSIS — Z951 Presence of aortocoronary bypass graft: Secondary | ICD-10-CM | POA: Insufficient documentation

## 2017-05-28 DIAGNOSIS — Z794 Long term (current) use of insulin: Secondary | ICD-10-CM | POA: Diagnosis not present

## 2017-05-28 DIAGNOSIS — Z79899 Other long term (current) drug therapy: Secondary | ICD-10-CM | POA: Diagnosis not present

## 2017-05-28 DIAGNOSIS — I2510 Atherosclerotic heart disease of native coronary artery without angina pectoris: Secondary | ICD-10-CM | POA: Insufficient documentation

## 2017-05-28 HISTORY — DX: Gastro-esophageal reflux disease without esophagitis: K21.9

## 2017-05-28 HISTORY — DX: Essential (primary) hypertension: I10

## 2017-05-28 HISTORY — PX: COLONOSCOPY WITH PROPOFOL: SHX5780

## 2017-05-28 HISTORY — PX: ESOPHAGOGASTRODUODENOSCOPY (EGD) WITH PROPOFOL: SHX5813

## 2017-05-28 LAB — GLUCOSE, CAPILLARY
Glucose-Capillary: 309 mg/dL — ABNORMAL HIGH (ref 65–99)
Glucose-Capillary: 327 mg/dL — ABNORMAL HIGH (ref 65–99)
Glucose-Capillary: 303 mg/dL — ABNORMAL HIGH (ref 65–99)

## 2017-05-28 SURGERY — COLONOSCOPY WITH PROPOFOL
Anesthesia: General

## 2017-05-28 MED ORDER — PROPOFOL 500 MG/50ML IV EMUL
INTRAVENOUS | Status: AC
  Filled 2017-05-28: qty 50

## 2017-05-28 MED ORDER — LIDOCAINE 2% (20 MG/ML) 5 ML SYRINGE
INTRAMUSCULAR | Status: DC | PRN
  Administered 2017-05-28: 25 mg via INTRAVENOUS

## 2017-05-28 MED ORDER — SODIUM CHLORIDE 0.9 % IV SOLN
INTRAVENOUS | Status: DC
  Administered 2017-05-28: 1000 mL via INTRAVENOUS

## 2017-05-28 MED ORDER — MIDAZOLAM HCL 2 MG/2ML IJ SOLN
INTRAMUSCULAR | Status: AC
  Filled 2017-05-28: qty 2

## 2017-05-28 MED ORDER — GLYCOPYRROLATE 0.2 MG/ML IJ SOLN
INTRAMUSCULAR | Status: AC
  Filled 2017-05-28: qty 1

## 2017-05-28 MED ORDER — PROPOFOL 10 MG/ML IV BOLUS
INTRAVENOUS | Status: DC | PRN
  Administered 2017-05-28: 70 mg via INTRAVENOUS
  Administered 2017-05-28: 30 mg via INTRAVENOUS

## 2017-05-28 MED ORDER — PROPOFOL 500 MG/50ML IV EMUL
INTRAVENOUS | Status: DC | PRN
  Administered 2017-05-28: 120 ug/kg/min via INTRAVENOUS

## 2017-05-28 MED ORDER — LIDOCAINE HCL (PF) 2 % IJ SOLN
INTRAMUSCULAR | Status: AC
  Filled 2017-05-28: qty 2

## 2017-05-28 MED ORDER — GLYCOPYRROLATE 0.2 MG/ML IJ SOLN
INTRAMUSCULAR | Status: DC | PRN
  Administered 2017-05-28: 0.2 mg via INTRAVENOUS

## 2017-05-28 MED ORDER — MIDAZOLAM HCL 5 MG/5ML IJ SOLN
INTRAMUSCULAR | Status: DC | PRN
  Administered 2017-05-28: 2 mg via INTRAVENOUS

## 2017-05-28 NOTE — Transfer of Care (Signed)
Immediate Anesthesia Transfer of Care Note  Patient: Catherine Kirby  Procedure(s) Performed: Procedure(s): COLONOSCOPY WITH PROPOFOL (N/A) ESOPHAGOGASTRODUODENOSCOPY (EGD) WITH PROPOFOL (N/A)  Patient Location: Endoscopy Unit  Anesthesia Type:General  Level of Consciousness: awake and alert   Airway & Oxygen Therapy: Patient Spontanous Breathing and Patient connected to nasal cannula oxygen  Post-op Assessment: Report given to RN and Post -op Vital signs reviewed and stable  Post vital signs: Reviewed  Last Vitals:  Vitals:   05/28/17 1048 05/28/17 1140  BP: (!) 152/59 125/70  Pulse: 70 73  Resp: 16 15  Temp: (!) 35.7 C     Last Pain:  Vitals:   05/28/17 1048  TempSrc: Tympanic         Complications: No apparent anesthesia complications

## 2017-05-28 NOTE — H&P (Signed)
Midge Minium, MD [facility] [address], [address] [place], [place] [address] Phone:[phone] Fax : [phone]  Primary Care Physician:  [facility] Primary Gastroenterologist:  Dr. Servando Snare  Pre-Procedure History & Physical: HPI:  Catherine Kirby is a 56 y.o. female is here for an endoscopy and colonoscopy.   Past Medical History:  Diagnosis Date  . Coronary artery disease   . Diabetes mellitus without complication (HCC)   . GERD (gastroesophageal reflux disease)   . Hypertension   . SVT (supraventricular tachycardia) (HCC)     Past Surgical History:  Procedure Laterality Date  . CARDIAC CATHETERIZATION  07/2010   Xience DES to mid RCA  . CARDIAC SURGERY    . CATARACT EXTRACTION W/ INTRAOCULAR LENS  IMPLANT, BILATERAL    . CORONARY ARTERY BYPASS GRAFT  2000   [place]    Prior to Admission medications   Medication Sig Start Date End Date Taking? Authorizing Provider  amitriptyline (ELAVIL) 25 MG tablet  12/24/16  Yes [provider]  atorvastatin (LIPITOR) 80 MG tablet 1 BY MOUTH DAILY IN THE EVENING FOR HIGH CHOLESTEROL 10/18/16  Yes [provider]  B-D INS SYR ULTRAFINE 1CC/30G 30G X 1/2" 1 ML MISC AS DIRECTED FOR INSULIN AS NEEDED FOR DIABETES 11/29/16  Yes [provider]  citalopram (CELEXA) 20 MG tablet Take 20 mg by mouth daily. 11/27/16  Yes [provider]  ferrous sulfate 325 (65 FE) MG tablet 1 BY MOUTH 3 TIMES A DAY FOR ANEMIA 12/11/16  Yes [provider]  gabapentin (NEURONTIN) 100 MG capsule 1 BY MOUTH 2 TIMES EACH DAY 12/03/16  Yes [provider]  LEVEMIR 100 UNIT/ML injection  12/28/16  Yes [provider]  metFORMIN (GLUCOPHAGE-XR) 500 MG 24 hr tablet Take 1,000 mg by mouth 2 (two) times daily. 12/07/16  Yes [provider]  metoprolol succinate (TOPROL-XL) 100 MG 24 hr tablet Take 100 mg by mouth daily. 10/17/16  Yes [provider]  naproxen  (NAPROSYN) 250 MG tablet  12/28/16  Yes [provider]  naproxen (NAPROSYN) 250 MG tablet Take 250 mg by mouth.   Yes [provider]  ranitidine (ZANTAC) 150 MG tablet Take 150 mg by mouth.   Yes [provider]    Allergies as of 05/10/2017  . (No Known Allergies)    Family History  Problem Relation Age of Onset  . Diabetes Mother   . Diabetes Father     Social History   Social History  . Marital status: Married    Spouse name: N/A  . Number of children: N/A  . Years of education: N/A   Occupational History  . Not on file.   Social History Main Topics  . Smoking status: Never Smoker  . Smokeless tobacco: Never Used  . Alcohol use No  . Drug use: No  . Sexual activity: Not on file   Other Topics Concern  . Not on file   Social History Narrative  . No narrative on file    Review of Systems: See HPI, otherwise negative ROS  Physical Exam: BP (!) 152/59   Pulse 70   Temp (!) 96.2 F (35.7 C) (Tympanic)   Resp 16   Ht 5' (1.524 m)   Wt 141 lb (64 kg)   SpO2 100%   BMI 27.54 kg/m  General:   Alert,  pleasant and cooperative in NAD Head:  Normocephalic and atraumatic. Neck:  Supple; no masses or thyromegaly.  Lungs:  Clear throughout to auscultation.    Heart:  Regular rate and rhythm. Abdomen:  Soft, nontender and nondistended. Normal bowel sounds, without guarding, and without rebound.   Neurologic:  Alert and  oriented x4;  grossly normal neurologically.  Impression/Plan: Catherine Kirby is here for an endoscopy and colonoscopy and EGD to be performed for IDA  Risks, benefits, limitations, and alternatives regarding  colonoscopy have been reviewed with the patient.  Questions have been answered.  All parties agreeable.   [name], MD  05/28/2017, 11:13 AM

## 2017-05-28 NOTE — Op Note (Signed)
[facility] Patient Name: Catherine Kirby Procedure Date: 05/28/2017 11:22 AM MRN: [id number] Account #: [id number] Date of Birth: 1961-08-10 Admit Type: Outpatient Age: 56 Room: [facility] ENDO ROOM 4 Gender: Female Note Status: Finalized Procedure:            Colonoscopy Indications:          Iron deficiency anemia Providers:            Midge Minium MD, MD Referring MD:         No Local Md, MD (Referring MD) Medicines:            Propofol per Anesthesia Complications:        No immediate complications. Procedure:            Pre-Anesthesia Assessment:                       - Prior to the procedure, a History and Physical was                        performed, and patient medications and allergies were                        reviewed. The patient's tolerance of previous                        anesthesia was also reviewed. The risks and benefits of                        the procedure and the sedation options and risks were                        discussed with the patient. All questions were                        answered, and informed consent was obtained. Prior                        Anticoagulants: The patient has taken no previous                        anticoagulant or antiplatelet agents. ASA Grade                        Assessment: II - A patient with mild systemic disease.                        After reviewing the risks and benefits, the patient was                        deemed in satisfactory condition to undergo the                        procedure.                       After obtaining informed consent, the colonoscope was                        passed under direct vision. Throughout the procedure,  the patient's blood pressure, pulse, and oxygen                        saturations were monitored continuously. The                        Colonoscope was introduced through the anus and                        advanced to  the the sigmoid colon. The colonoscopy was                        performed without difficulty. The patient tolerated the                        procedure well. The quality of the bowel preparation                        was not adequate to identify polyps 6 mm and larger in                        size. Findings:      The perianal and digital rectal examinations were normal.      A large amount of stool was found in the rectum, in the recto-sigmoid       colon and in the sigmoid colon, precluding visualization. Impression:           - Preparation of the colon was inadequate.                       - Stool in the rectum, in the recto-sigmoid colon and                        in the sigmoid colon.                       - No specimens collected. Recommendation:       - Discharge patient to home.                       - Resume previous diet.                       - Continue present medications. Procedure Code(s):    --- Professional ---                       [phone], 53, Colonoscopy, flexible; diagnostic, including                        collection of specimen(s) by brushing or washing, when                        performed (separate procedure) Diagnosis Code(s):    --- Professional ---                       D50.9, Iron deficiency anemia, unspecified CPT copyright 2016 American Medical Association. All rights reserved. The codes documented in this report are preliminary and upon coder review may  be revised to meet current compliance requirements. Midge Minium MD, MD 05/28/2017 11:37:28 AM This report has been signed  electronically. Number of Addenda: 0 Note Initiated On: 05/28/2017 11:22 AM Total Procedure Duration: 0 hours 1 minute 58 seconds       [facility]

## 2017-05-28 NOTE — Anesthesia Post-op Follow-up Note (Cosign Needed)
Anesthesia QCDR form completed.        

## 2017-05-28 NOTE — Anesthesia Postprocedure Evaluation (Signed)
Anesthesia Post Note  Patient: Catherine Kirby  Procedure(s) Performed: Procedure(s) (LRB): COLONOSCOPY WITH PROPOFOL (N/A) ESOPHAGOGASTRODUODENOSCOPY (EGD) WITH PROPOFOL (N/A)  Patient location during evaluation: PACU Anesthesia Type: General Level of consciousness: awake Pain management: pain level controlled Vital Signs Assessment: post-procedure vital signs reviewed and stable Respiratory status: spontaneous breathing Cardiovascular status: stable Anesthetic complications: no     Last Vitals:  Vitals:   05/28/17 1205 05/28/17 1210  BP:  (!) 142/99  Pulse: 72 73  Resp: 15 15  Temp:      Last Pain:  Vitals:   05/28/17 1048  TempSrc: Tympanic                 [name],Catherine Oriol

## 2017-05-28 NOTE — Anesthesia Preprocedure Evaluation (Addendum)
Anesthesia Evaluation  Patient identified by MRN, date of birth, ID band Patient awake    Reviewed: Allergy & Precautions, NPO status , Patient's Chart, lab work & pertinent test results  Airway Mallampati: II       Dental  (+) Teeth Intact   Pulmonary neg pulmonary ROS,     + decreased breath sounds      Cardiovascular Exercise Tolerance: Good hypertension, Pt. on medications and Pt. on home beta blockers + CAD and + CABG  + dysrhythmias Supra Ventricular Tachycardia  Rhythm:Regular     Neuro/Psych negative neurological ROS  negative psych ROS   GI/Hepatic Neg liver ROS, GERD  Medicated,  Endo/Other  diabetes, Type 2, Oral Hypoglycemic Agents  Renal/GU negative Renal ROS     Musculoskeletal   Abdominal Normal abdominal exam  (+)   Peds negative pediatric ROS (+)  Hematology   Anesthesia Other Findings   Reproductive/Obstetrics                            Anesthesia Physical Anesthesia Plan  ASA: III  Anesthesia Plan: General   Post-op Pain Management:    Induction: Intravenous  PONV Risk Score and Plan: 1 and Ondansetron  Airway Management Planned: Natural Airway  Additional Equipment:   Intra-op Plan:   Post-operative Plan:   Informed Consent: I have reviewed the patients History and Physical, chart, labs and discussed the procedure including the risks, benefits and alternatives for the proposed anesthesia with the patient or authorized representative who has indicated his/her understanding and acceptance.     Plan Discussed with: CRNA  Anesthesia Plan Comments:         Anesthesia Quick Evaluation

## 2017-05-28 NOTE — Op Note (Signed)
[facility] Gastroenterology Patient Name: Catherine Kirby Procedure Date: 05/28/2017 11:23 AM MRN: [id number] Account #: [id number] Date of Birth: May 11, 1961 Admit Type: Outpatient Age: 56 Room: [facility] ENDO ROOM 4 Gender: Female Note Status: Finalized Procedure:            Upper GI endoscopy Indications:          Iron deficiency anemia Providers:            Catherine Minium MD, Kirby Referring Kirby:         Catherine Local Md, Kirby (Referring Kirby) Medicines:            Propofol per Anesthesia Complications:        Catherine immediate complications. Procedure:            Pre-Anesthesia Assessment:                       - Prior to the procedure, a History and Physical was                        performed, and patient medications and allergies were                        reviewed. The patient's tolerance of previous                        anesthesia was also reviewed. The risks and benefits of                        the procedure and the sedation options and risks were                        discussed with the patient. All questions were                        answered, and informed consent was obtained. Prior                        Anticoagulants: The patient has taken Catherine previous                        anticoagulant or antiplatelet agents. ASA Grade                        Assessment: II - A patient with mild systemic disease.                        After reviewing the risks and benefits, the patient was                        deemed in satisfactory condition to undergo the                        procedure.                       After obtaining informed consent, the endoscope was                        passed under direct vision. Throughout the procedure,  the patient's blood pressure, pulse, and oxygen                        saturations were monitored continuously. The Endoscope                        was introduced through the mouth, and advanced to the           second part of duodenum. The upper GI endoscopy was                        accomplished without difficulty. The patient tolerated                        the procedure well. Findings:      The examined esophagus was normal.      The stomach was normal.      The examined duodenum was normal. Impression:           - Normal esophagus.                       - Normal stomach.                       - Normal examined duodenum.                       - Catherine specimens collected. Recommendation:       - Discharge patient to home.                       - Resume previous diet.                       - Continue present medications.                       - Perform a colonoscopy today. Procedure Code(s):    --- Professional ---                       [id number], Esophagogastroduodenoscopy, flexible, transoral;                        diagnostic, including collection of specimen(s) by                        brushing or washing, when performed (separate procedure) Diagnosis Code(s):    --- Professional ---                       D50.9, Iron deficiency anemia, unspecified CPT copyright 2016 American Medical Association. All rights reserved. The codes documented in this report are preliminary and upon coder review may  be revised to meet current compliance requirements. Catherine Miniumarren [name] MD, Kirby 05/28/2017 11:31:46 AM This report has been signed electronically. Number of Addenda: 0 Note Initiated On: 05/28/2017 11:23 AM      [facility]

## 2017-05-29 ENCOUNTER — Encounter: Payer: Self-pay | Admitting: Gastroenterology

## 2017-12-29 NOTE — Progress Notes (Signed)
12/30/2017 11:06 AM   Catherine Kirby 1961/02/22 [id number]  Referring provider: [facility], [address] [name], [place] [address]  Chief Complaint  Patient presents with  . New Patient (Initial Visit)    HPI: Patient is a 57 -year-old Hispanic female who is referred to [place] by Dr. Baltazar Apo  for recurrent urinary tract infections with interpreter, Leola Brazil.    Patient states that she has had four urinary tract infections over the last year.  Reviewing her records, she has had 6 documented UTI's over the last year.  Cultures have been positive for E. Coli with variable resistance patterns.  Her symptoms with a urinary tract infection consist of malodorous urine.  She denies dysuria, gross hematuria, suprapubic pain, back pain, abdominal pain or flank pain with UTI's.  She has not had any recent fevers, chills, nausea or vomiting with UTI's.    She is currently not having UTI symptoms at this time.  She has not other urinary symptoms at this time.  Her PVR is 0 mL.    She does not have a history of nephrolithiasis, GU surgery or GU trauma.   She is not sexually active.  She is postmenopausal.   She admits to constipation.  She does not engage in good perineal hygiene. She does not take tub baths.   She does not have incontinence.  She is using incontinence pads.  She is not having pain with bladder filling.    She has not had any recent imaging studies.  She is not drinking a lot of water daily.  She drinks diet sodas daily.  She does not drink juices.  She is not taking probiotics.    Reviewed referral notes.   - see above   PMH: Past Medical History:  Diagnosis Date  . Coronary artery disease   . Depression   . Diabetes mellitus without complication ([place])   . GERD (gastroesophageal reflux disease)   . High cholesterol   . Hypertension   . SVT (supraventricular tachycardia) [facility])     Surgical History: Past Surgical History:   Procedure Laterality Date  . CARDIAC CATHETERIZATION  07/2010   Xience DES to mid RCA  . CARDIAC SURGERY    . CATARACT EXTRACTION W/ INTRAOCULAR LENS  IMPLANT, BILATERAL    . COLONOSCOPY WITH PROPOFOL N/A 05/28/2017   Procedure: COLONOSCOPY WITH PROPOFOL;  Surgeon: Lucilla Lame, MD;  Location: [facility] ENDOSCOPY;  Service: Endoscopy;  Laterality: N/A;  . CORONARY ARTERY BYPASS GRAFT  2000     . ESOPHAGOGASTRODUODENOSCOPY (EGD) WITH PROPOFOL N/A 05/28/2017   Procedure: ESOPHAGOGASTRODUODENOSCOPY (EGD) WITH PROPOFOL;  Surgeon: Lucilla Lame, MD;  Location: [facility] ENDOSCOPY;  Service: Endoscopy;  Laterality: N/A;    Home Medications:  Allergies as of 12/30/2017   No Known Allergies     Medication List        Accurate as of 12/30/17 11:06 AM. Always use your most recent med list.          amitriptyline 25 MG tablet Commonly known as:  ELAVIL   aspirin EC 81 MG tablet Take 81 mg by mouth.   atorvastatin 80 MG tablet Commonly known as:  LIPITOR 1 BY MOUTH DAILY IN THE EVENING FOR HIGH CHOLESTEROL   B-D INS SYR ULTRAFINE 1CC/30G 30G X 1/2" 1 ML Misc Generic drug:  Insulin Syringe-Needle U-100 AS DIRECTED FOR INSULIN AS NEEDED FOR DIABETES   citalopram 20 MG tablet Commonly known as:  CELEXA  Take 20 mg by mouth daily.   ferrous sulfate 325 (65 FE) MG tablet 1 BY MOUTH 3 TIMES A DAY FOR ANEMIA   gabapentin 100 MG capsule Commonly known as:  NEURONTIN 1 BY MOUTH 2 TIMES EACH DAY   LEVEMIR 100 UNIT/ML injection Generic drug:  insulin detemir   metFORMIN 500 MG 24 hr tablet Commonly known as:  GLUCOPHAGE-XR Take 1,000 mg by mouth 2 (two) times daily.   metoprolol succinate 100 MG 24 hr tablet Commonly known as:  TOPROL-XL Take 100 mg by mouth daily.   naproxen 250 MG tablet Commonly known as:  NAPROSYN Take 250 mg by mouth.   naproxen 250 MG tablet Commonly known as:  NAPROSYN   ranitidine 150 MG tablet Commonly known as:  ZANTAC Take 150 mg by mouth.         Allergies: No Known Allergies  Family History: Family History  Problem Relation Age of Onset  . Diabetes Mother   . Kidney disease Mother   . Diabetes Father   . Kidney disease Sister   . Kidney cancer Neg Hx   . Prostate cancer Neg Hx     Social History:  reports that  has never smoked. she has never used smokeless tobacco. She reports that she does not drink alcohol or use drugs.  ROS: UROLOGY Frequent Urination?: No Hard to postpone urination?: No Burning/pain with urination?: No Get up at night to urinate?: No Leakage of urine?: No Urine stream starts and stops?: No Trouble starting stream?: No Do you have to strain to urinate?: No Blood in urine?: No Urinary tract infection?: Yes Sexually transmitted disease?: No Injury to kidneys or bladder?: No Painful intercourse?: No Weak stream?: No Currently pregnant?: No Vaginal bleeding?: No Last menstrual period?: n  Gastrointestinal Nausea?: No Vomiting?: No Indigestion/heartburn?: No Diarrhea?: No Constipation?: No  Constitutional Fever: No Night sweats?: No Weight loss?: No Fatigue?: No  Skin Skin rash/lesions?: No Itching?: Yes  Eyes Blurred vision?: No Double vision?: No  Ears/Nose/Throat Sore throat?: No Sinus problems?: No  Hematologic/Lymphatic Swollen glands?: No Easy bruising?: Yes  Cardiovascular Leg swelling?: Yes Chest pain?: No  Respiratory Cough?: No Shortness of breath?: No  Endocrine Excessive thirst?: No  Musculoskeletal Back pain?: No Joint pain?: Yes  Neurological Headaches?: No Dizziness?: No  Psychologic Depression?: No Anxiety?: No  Physical Exam: BP 120/70   Pulse 68   Ht 5' (1.524 m)   Wt 139 lb (63 kg)   BMI 27.15 kg/m   Constitutional: Well nourished. Alert and oriented, No acute distress. HEENT: [place] AT, moist mucus membranes. Trachea midline, no masses. Cardiovascular: No clubbing, cyanosis, or edema. Respiratory: Normal respiratory  effort, no increased work of breathing. GI: Abdomen is soft, non tender, non distended, no abdominal masses. Liver and spleen not palpable.  No hernias appreciated.  Stool sample for occult testing is not indicated.   GU: No CVA tenderness.  No bladder fullness or masses.  Atrophic external genitalia, normal pubic hair distribution, no lesions.  Normal urethral meatus, no lesions, no prolapse, no discharge.   No urethral masses, tenderness and/or tenderness. No bladder fullness, tenderness or masses. Pale vagina mucosa, poor estrogen effect, no discharge, no lesions, good pelvic support, Grade II cystocele is noted.  No rectocele is noted.  Slightly tender in pelvic area due to vaginal atrophy.  No cervical motion tenderness.  Uterus is freely mobile and non-fixed.  No adnexal/parametria masses or tenderness noted.  Anus and perineum are without rashes or lesions.  Skin: No rashes, bruises or suspicious lesions. Lymph: No cervical or inguinal adenopathy. Neurologic: Grossly intact, no focal deficits, moving all 4 extremities. Psychiatric: Normal mood and affect.  Laboratory Data: See above  I have reviewed the labs.   Pertinent Imaging: Results for DE MARYLYN, APPENZELLER (MRN [id number]) as of 12/30/2017 11:23  Ref. Range 12/30/2017 10:41  Scan Result Unknown 0     Assessment & Plan:    1. Recurrent UTI's  - criteria for recurrent UTI has been met with 2 or more infections in 6 months or 3 or greater infections in one year   - Patient is instructed to increase their water intake until the urine is pale yellow or clear (10 to 12 cups daily) - explained that her lack of water intake is making her susceptible to UTI's  - probiotics (yogurt, oral pills or vaginal suppositories), take cranberry pills or drink the juice and Vitamin C 1,000 mg daily to acidify the urine should be added to their daily regimen   - avoid soaking in tubs and wipe front to back after urinating   - benefit from core  strengthening exercises has been seen.  We can refer her to PT if they desire - referral placed  - advised them to have CATH UA's for urinalysis and culture to prevent skin contamination of the specimen  - reviewed symptoms of UTI and advised not to have urine checked or be treated for UTI if not experiencing symptoms   2. Vaginal atrophy  - I explained to the patient that when women go through menopause and her estrogen levels are severely diminished, the normal vaginal flora will change.  This is due to an increase of the vaginal canal's pH. Because of this, the vaginal canal may be colonized by bacteria from the rectum instead of the protective lactobacillus.  This, accompanied by the loss of the mucus barrier with vaginal atrophy, is a cause of recurrent urinary tract infections.  - In some studies, the use of vaginal estrogen cream has been demonstrated to reduce  recurrent urinary tract infections to one a year.   - Patient advised to have a screening mammogram prior to beginning any estrogen therapy  - will contact PCP to schedule appointment  - will refer to PT in the interim   3. Pelvic pain  - see above                                             Return in about 3 months (around 03/30/2018) for PVR and OAB questionnaire.  These notes generated with voice recognition software. I apologize for typographical errors.  Zara Council, [facility] [address], [place], [place] [address] [phone]

## 2017-12-30 ENCOUNTER — Telehealth: Payer: Self-pay | Admitting: Urology

## 2017-12-30 ENCOUNTER — Encounter: Payer: Self-pay | Admitting: Urology

## 2017-12-30 ENCOUNTER — Ambulatory Visit (INDEPENDENT_AMBULATORY_CARE_PROVIDER_SITE_OTHER): Payer: Medicare Other | Admitting: Urology

## 2017-12-30 VITALS — BP 120/70 | HR 68 | Ht 60.0 in | Wt 139.0 lb

## 2017-12-30 DIAGNOSIS — R102 Pelvic and perineal pain: Secondary | ICD-10-CM

## 2017-12-30 DIAGNOSIS — N952 Postmenopausal atrophic vaginitis: Secondary | ICD-10-CM

## 2017-12-30 LAB — BLADDER SCAN AMB NON-IMAGING: Scan Result: 0

## 2017-12-30 NOTE — Telephone Encounter (Signed)
Will you sent my notes to Dr. Maryruth HancockSallie Patel at [facility]?

## 2017-12-30 NOTE — Telephone Encounter (Signed)
done

## 2018-04-01 ENCOUNTER — Ambulatory Visit: Payer: Medicare Other | Admitting: Urology

## 2018-06-02 ENCOUNTER — Ambulatory Visit: Payer: Medicare Other | Admitting: Urology

## 2018-07-09 ENCOUNTER — Ambulatory Visit: Payer: Medicare Other | Admitting: Urology

## 2020-09-27 ENCOUNTER — Encounter: Payer: Self-pay | Admitting: Family Medicine

## 2020-10-05 ENCOUNTER — Encounter: Payer: Self-pay | Admitting: *Deleted

## 2020-12-08 ENCOUNTER — Encounter: Payer: Self-pay | Admitting: Gastroenterology

## 2020-12-08 ENCOUNTER — Ambulatory Visit (INDEPENDENT_AMBULATORY_CARE_PROVIDER_SITE_OTHER): Payer: Medicare Other | Admitting: Gastroenterology

## 2020-12-08 VITALS — BP 124/66 | HR 84 | Ht 61.0 in | Wt 141.8 lb

## 2020-12-08 DIAGNOSIS — R195 Other fecal abnormalities: Secondary | ICD-10-CM | POA: Diagnosis not present

## 2020-12-08 DIAGNOSIS — Z862 Personal history of diseases of the blood and blood-forming organs and certain disorders involving the immune mechanism: Secondary | ICD-10-CM | POA: Diagnosis not present

## 2020-12-08 NOTE — Progress Notes (Signed)
Wyline Mood MD, MRCP(U.K) [address]  [address]  [place], [place] [address]  Main: [phone]  Fax: [phone]   Gastroenterology Consultation  Referring Provider:     Hillery Aldo, MD Primary Care Physician:  [facility] Primary Gastroenterologist:  Dr. Wyline Mood  Reason for Consultation:     Referred for other fecal abnormalities in October 2021        HPI:   Catherine Kirby is a 59 y.o. y/o female referred for a positive stool occult blood testing 09/05/2020.  HbA1c at that time was 12.6.  05/28/2017: Colonoscopy performed at [facility] for iron deficiency anemia showed stool in the rectum rectosigmoid and was advised to repeat the procedure due to poor prep.  At the same time she had an upper endoscopy performed which was normal I do not have a recent CBC to determine if she is anemic.  In May 2019 hemoglobin is 11.2 g with an MCV of 88.4  She is here today with her daughter and interpreter.  She speaks only [place].  She says she has not seen any blood in the stools.  Denies any GI symptoms.  She recollect she was told she was anemic but was not told reason for the same.  She had been taking iron tablets which have been stopped.  Denies any family history of colon cancer or polyps.  Past Medical History:  Diagnosis Date  . Coronary artery disease   . Depression   . Diabetes mellitus without complication ([facility])   . GERD (gastroesophageal reflux disease)   . High cholesterol   . Hypertension   . SVT (supraventricular tachycardia) ([facility])     Past Surgical History:  Procedure Laterality Date  . CARDIAC CATHETERIZATION  07/2010   Xience DES to mid RCA  . CARDIAC SURGERY    . CATARACT EXTRACTION W/ INTRAOCULAR LENS  IMPLANT, BILATERAL    . COLONOSCOPY WITH PROPOFOL N/A 05/28/2017   Procedure: COLONOSCOPY WITH PROPOFOL;  Surgeon: Midge Minium, MD;  Location: [facility] ENDOSCOPY;  Service: Endoscopy;  Laterality: N/A;  . CORONARY ARTERY BYPASS GRAFT   2000     . ESOPHAGOGASTRODUODENOSCOPY (EGD) WITH PROPOFOL N/A 05/28/2017   Procedure: ESOPHAGOGASTRODUODENOSCOPY (EGD) WITH PROPOFOL;  Surgeon: Midge Minium, MD;  Location: [facility] ENDOSCOPY;  Service: Endoscopy;  Laterality: N/A;    Prior to Admission medications   Medication Sig Start Date End Date Taking? Authorizing Provider  amitriptyline (ELAVIL) 25 MG tablet  12/24/16   [provider]  aspirin EC 81 MG tablet Take 81 mg by mouth.    [provider]  atorvastatin (LIPITOR) 80 MG tablet 1 BY MOUTH DAILY IN THE EVENING FOR HIGH CHOLESTEROL 10/18/16   [provider]  B-D INS SYR ULTRAFINE 1CC/30G 30G X 1/2" 1 ML MISC AS DIRECTED FOR INSULIN AS NEEDED FOR DIABETES 11/29/16   [provider]  citalopram (CELEXA) 20 MG tablet Take 20 mg by mouth daily. 11/27/16   [provider]  ferrous sulfate 325 (65 FE) MG tablet 1 BY MOUTH 3 TIMES A DAY FOR ANEMIA 12/11/16   [provider]  gabapentin (NEURONTIN) 100 MG capsule 1 BY MOUTH 2 TIMES EACH DAY 12/03/16   [provider]  LEVEMIR 100 UNIT/ML injection  12/28/16   [provider]  metFORMIN (GLUCOPHAGE-XR) 500 MG 24 hr tablet Take 1,000 mg by mouth 2 (two) times daily. 12/07/16   [provider]  metoprolol succinate (TOPROL-XL) 100 MG 24 hr tablet  Take 100 mg by mouth daily. 10/17/16   [provider]  naproxen (NAPROSYN) 250 MG tablet  12/28/16   [provider]  naproxen (NAPROSYN) 250 MG tablet Take 250 mg by mouth.    [provider]  ranitidine (ZANTAC) 150 MG tablet Take 150 mg by mouth.    [provider]    Family History  Problem Relation Age of Onset  . Diabetes Mother   . Kidney disease Mother   . Diabetes Father   . Kidney disease Sister   . Kidney cancer Neg Hx   . Prostate cancer Neg Hx      Social History   Tobacco Use  . Smoking status: Never Smoker  . Smokeless tobacco: Never Used  Substance  Use Topics  . Alcohol use: No  . Drug use: No    Allergies as of 12/08/2020  . (No Known Allergies)    Review of Systems:    All systems reviewed and negative except where noted in HPI.   Physical Exam:  There were no vitals taken for this visit. No LMP recorded. Patient is postmenopausal. Psych:  Alert and cooperative. Normal mood and affect. General:   Alert,  Well-developed, well-nourished, pleasant and cooperative in NAD Head:  Normocephalic and atraumatic. Eyes:  Sclera clear, no icterus.   Conjunctiva pink. Lungs:  Respirations even and unlabored.  Clear throughout to auscultation.   No wheezes, crackles, or rhonchi. No acute distress. Heart:  Regular rate and rhythm; no murmurs, clicks, rubs, or gallops. Abdomen:  Normal bowel sounds.  No bruits.  Soft, non-tender and non-distended without masses, hepatosplenomegaly or hernias noted.  No guarding or rebound tenderness.    Neurologic:  Alert and oriented x3;  grossly normal neurologically. Psych:  Alert and cooperative. Normal mood and affect.  Imaging Studies: No results found.  Assessment and Plan:   Catherine Kirby is a 59 y.o. y/o female has been referred for stool occult blood test was positive.  In 2018 underwent an EGD and colonoscopy for iron deficiency anemia.  EGD was normal.  Colonoscopy was incomplete due to poor prep.  She was supposed to have a repeat procedure but did not follow-up.  No recent labs to suggest she is anemic.  Plan 1.  Check CBC and iron studies 2.  I strongly suggest that she undergo a colonoscopy to evaluate the abnormal stool test.  Explained that underlying neoplasm could cause an abnormal stool test and it was very important.  She repeated that she felt well at this point of time.  I did explain to her that often colon cancer there are no symptoms to late in the disease and feeling well is not sufficient reason to not undergo diagnostic testing.  I informed her that if she were to change  her mind that she should contact our office ASAP to get scheduled.      Follow up as needed  Dr Wyline Mood MD,MRCP(U.K)

## 2020-12-09 LAB — CBC WITH DIFFERENTIAL/PLATELET
Basophils Absolute: 0.1 10*3/uL (ref 0.0–0.2)
Basos: 1 %
EOS (ABSOLUTE): 0.5 10*3/uL — ABNORMAL HIGH (ref 0.0–0.4)
Eos: 6 %
Hematocrit: 35.2 % (ref 34.0–46.6)
Hemoglobin: 10.4 g/dL — ABNORMAL LOW (ref 11.1–15.9)
Immature Grans (Abs): 0 10*3/uL (ref 0.0–0.1)
Immature Granulocytes: 0 %
Lymphocytes Absolute: 2.9 10*3/uL (ref 0.7–3.1)
Lymphs: 39 %
MCH: 23.7 pg — ABNORMAL LOW (ref 26.6–33.0)
MCHC: 29.5 g/dL — ABNORMAL LOW (ref 31.5–35.7)
MCV: 80 fL (ref 79–97)
Monocytes Absolute: 0.5 10*3/uL (ref 0.1–0.9)
Monocytes: 6 %
Neutrophils Absolute: 3.5 10*3/uL (ref 1.4–7.0)
Neutrophils: 48 %
Platelets: 314 10*3/uL (ref 150–450)
RBC: 4.39 x10E6/uL (ref 3.77–5.28)
RDW: 15.8 % — ABNORMAL HIGH (ref 11.7–15.4)
WBC: 7.4 10*3/uL (ref 3.4–10.8)

## 2020-12-09 LAB — IRON,TIBC AND FERRITIN PANEL
Ferritin: 6 ng/mL — ABNORMAL LOW (ref 15–150)
Iron Saturation: 6 % — CL (ref 15–55)
Iron: 24 ug/dL — ABNORMAL LOW (ref 27–139)
Total Iron Binding Capacity: 375 ug/dL (ref 250–450)
UIBC: 351 ug/dL (ref 118–369)

## 2020-12-20 ENCOUNTER — Telehealth: Payer: Self-pay

## 2020-12-20 MED ORDER — FERROUS SULFATE 325 (65 FE) MG PO TABS: 325.0000 mg | ORAL_TABLET | Freq: Two times a day (BID) | ORAL | 2 refills | Status: AC

## 2020-12-20 NOTE — Telephone Encounter (Signed)
-----   Message from Wyline Mood, MD sent at 12/14/2020 10:11 AM EST ----- Inform the patient that she is anemic and she does have severe iron deficiency.  Suggest her to start on oral iron tablets.  Ferrous sulfate 325 mg twice daily.  I strongly suggest her to undergo colonoscopy which I explained to her at her last office visit.  After the colonoscopy I strongly suggested her to come back and see me to follow-up.  Please send a note to the referring physician that we have strongly recommended her to have a colonoscopy and she does have iron deficiency anemia.  If the patient declines please document the same

## 2020-12-20 NOTE — Telephone Encounter (Signed)
Called patient to let her know the below information. Patient stated that she would like for [place] to send the prescription to her pharmacy [facility] in [place]. I told that she did not need a prescription but she insisted. Therefore, I will send the prescription and the pharmacy would direct her to the right location to obtain her medication. I also asked patient if she was wanting to schedule her colonoscopy as recommended by Dr. Tobi Bastos. Patient stated that she was not interested at this time to go ahead and let her PCP know since she had a follow up appointment with her and that way she would ask if she should schedule the colonoscopy or not. I told her that I would contact her PCP. Patient hd no further questions. I then called patient's PCP and was not able to get in contact with anybody or leave a message since it was full. I will send them a fax letting them know what the patient is needing to have done per Dr. Tobi Bastos, so they could convince the patient to have the procedure.

## 2021-05-10 ENCOUNTER — Other Ambulatory Visit: Payer: Self-pay | Admitting: Family Medicine

## 2021-05-10 DIAGNOSIS — Z1231 Encounter for screening mammogram for malignant neoplasm of breast: Secondary | ICD-10-CM

## 2021-07-06 ENCOUNTER — Other Ambulatory Visit: Payer: Self-pay | Admitting: Family Medicine

## 2021-07-06 DIAGNOSIS — Z1231 Encounter for screening mammogram for malignant neoplasm of breast: Secondary | ICD-10-CM
